# Patient Record
Sex: Female | Born: 1980 | Race: White | Hispanic: No | Marital: Married | Smoking: Never smoker
Health system: Southern US, Community
[De-identification: ages and names within clinical notes are randomized; demographics above are authoritative.]

## PROBLEM LIST (undated history)

## (undated) ENCOUNTER — Ambulatory Visit

## (undated) ENCOUNTER — Encounter

## (undated) HISTORY — PX: AUGMENTATION MAMMAPLASTY: SUR837

## (undated) HISTORY — PX: BREAST SURGERY: SHX581

---

## 1998-10-18 ENCOUNTER — Other Ambulatory Visit: Admission: RE | Admit: 1998-10-18 | Discharge: 1998-10-18 | Payer: Self-pay | Admitting: Obstetrics and Gynecology

## 1999-04-09 ENCOUNTER — Other Ambulatory Visit: Admission: RE | Admit: 1999-04-09 | Discharge: 1999-04-09 | Payer: Self-pay | Admitting: Obstetrics and Gynecology

## 1999-04-30 ENCOUNTER — Encounter (INDEPENDENT_AMBULATORY_CARE_PROVIDER_SITE_OTHER): Payer: Self-pay | Admitting: Specialist

## 1999-04-30 ENCOUNTER — Other Ambulatory Visit: Admission: RE | Admit: 1999-04-30 | Discharge: 1999-04-30 | Payer: Self-pay | Admitting: Obstetrics and Gynecology

## 1999-10-02 ENCOUNTER — Other Ambulatory Visit: Admission: RE | Admit: 1999-10-02 | Discharge: 1999-10-02 | Payer: Self-pay | Admitting: Obstetrics and Gynecology

## 2000-03-24 ENCOUNTER — Other Ambulatory Visit: Admission: RE | Admit: 2000-03-24 | Discharge: 2000-03-24 | Payer: Self-pay | Admitting: Obstetrics and Gynecology

## 2001-02-04 ENCOUNTER — Other Ambulatory Visit: Admission: RE | Admit: 2001-02-04 | Discharge: 2001-02-04 | Payer: Self-pay | Admitting: Obstetrics and Gynecology

## 2001-09-22 ENCOUNTER — Other Ambulatory Visit: Admission: RE | Admit: 2001-09-22 | Discharge: 2001-09-22 | Payer: Self-pay | Admitting: Obstetrics and Gynecology

## 2002-04-27 ENCOUNTER — Other Ambulatory Visit: Admission: RE | Admit: 2002-04-27 | Discharge: 2002-04-27 | Payer: Self-pay | Admitting: Obstetrics and Gynecology

## 2003-05-12 ENCOUNTER — Other Ambulatory Visit: Admission: RE | Admit: 2003-05-12 | Discharge: 2003-05-12 | Payer: Self-pay | Admitting: Obstetrics and Gynecology

## 2004-11-01 ENCOUNTER — Other Ambulatory Visit: Admission: RE | Admit: 2004-11-01 | Discharge: 2004-11-01 | Payer: Self-pay | Admitting: Obstetrics and Gynecology

## 2007-04-28 ENCOUNTER — Inpatient Hospital Stay (HOSPITAL_COMMUNITY): Admission: AD | Admit: 2007-04-28 | Discharge: 2007-05-01 | Payer: Self-pay | Admitting: Obstetrics and Gynecology

## 2007-06-29 ENCOUNTER — Emergency Department (HOSPITAL_BASED_OUTPATIENT_CLINIC_OR_DEPARTMENT_OTHER): Admission: EM | Admit: 2007-06-29 | Discharge: 2007-06-29 | Payer: Self-pay | Admitting: Emergency Medicine

## 2010-10-22 LAB — CBC
HCT: 29 — ABNORMAL LOW
Hemoglobin: 10.4 — ABNORMAL LOW
MCHC: 35.7
MCV: 94.3
RBC: 3.08 — ABNORMAL LOW
RDW: 12.9

## 2010-10-23 LAB — CBC
HCT: 24.9 — ABNORMAL LOW
MCHC: 34.8
MCV: 95.1
Platelets: 221
RDW: 13.6

## 2010-10-25 LAB — DIFFERENTIAL
Basophils Relative: 2 — ABNORMAL HIGH
Eosinophils Absolute: 0.2
Lymphs Abs: 1.5
Monocytes Relative: 6
Neutro Abs: 5.1
Neutrophils Relative %: 69

## 2010-10-25 LAB — CBC
MCHC: 35.2
MCV: 91.6
Platelets: 337
RBC: 3.83 — ABNORMAL LOW
WBC: 7.4

## 2010-10-25 LAB — URINALYSIS, ROUTINE W REFLEX MICROSCOPIC
Hgb urine dipstick: NEGATIVE
Protein, ur: NEGATIVE
Specific Gravity, Urine: 1.017
Urobilinogen, UA: 0.2

## 2010-10-25 LAB — BASIC METABOLIC PANEL
BUN: 24 — ABNORMAL HIGH
Calcium: 9.3
Creatinine, Ser: 1.1
GFR calc Af Amer: >60

## 2010-10-25 LAB — PREGNANCY, URINE: Preg Test, Ur: NEGATIVE

## 2013-09-21 ENCOUNTER — Other Ambulatory Visit: Payer: Self-pay | Admitting: Obstetrics and Gynecology

## 2013-09-21 DIAGNOSIS — R928 Other abnormal and inconclusive findings on diagnostic imaging of breast: Secondary | ICD-10-CM

## 2013-09-22 ENCOUNTER — Ambulatory Visit
Admission: RE | Admit: 2013-09-22 | Discharge: 2013-09-22 | Disposition: A | Discharge status: Home / Self Care | Payer: BC Managed Care – PPO | Source: Ambulatory Visit | Attending: Obstetrics and Gynecology | Admitting: Obstetrics and Gynecology

## 2013-09-22 ENCOUNTER — Encounter (INDEPENDENT_AMBULATORY_CARE_PROVIDER_SITE_OTHER): Payer: Self-pay

## 2013-09-22 DIAGNOSIS — R928 Other abnormal and inconclusive findings on diagnostic imaging of breast: Secondary | ICD-10-CM

## 2013-09-27 ENCOUNTER — Other Ambulatory Visit: Payer: Self-pay

## 2016-01-19 ENCOUNTER — Ambulatory Visit (HOSPITAL_COMMUNITY)
Admission: EM | Admit: 2016-01-19 | Discharge: 2016-01-19 | Disposition: A | Discharge status: Home / Self Care | Payer: BLUE CROSS/BLUE SHIELD | Attending: Family Medicine | Admitting: Family Medicine

## 2016-01-19 ENCOUNTER — Encounter (HOSPITAL_COMMUNITY): Payer: Self-pay | Admitting: Family Medicine

## 2016-01-19 DIAGNOSIS — F411 Generalized anxiety disorder: Secondary | ICD-10-CM | POA: Diagnosis not present

## 2016-01-19 MED ORDER — ALPRAZOLAM 0.25 MG PO TABS: 0.2500 mg | ORAL_TABLET | Freq: Every day | ORAL | 3 refills | Status: AC | PRN

## 2016-01-19 MED ORDER — FLUOXETINE HCL 10 MG PO TABS: 10.0000 mg | ORAL_TABLET | Freq: Every day | ORAL | 3 refills | Status: AC

## 2016-01-19 NOTE — ED Triage Notes (Signed)
Pt here for anxiety for months since she suffered a loss.

## 2016-01-19 NOTE — ED Provider Notes (Signed)
[facility]    CSN: [id number] Arrival date & time: 01/19/16  1437     History   Chief Complaint Chief Complaint  Patient presents with  . Anxiety    HPI Connie Espinoza is a 35 y.o. female.   This is a married 35 year old woman with an 37-year-old daughter. She been in her usual state of health until 2 months ago when her dog died and her father-in-law passed away. Since that time she's had overwhelming anxiety. She doesn't understand why this illness. She's been to see 3 doctors this week and a Veterinary surgeoncounselor. She plans on continuing with a counselor.  She's had workup including blood work and CAT scan of her brain and all this is been normal. She realizes that she has an anxiety problem but doesn't understand why.      History reviewed. No pertinent past medical history.  There are no active problems to display for this patient.   Past Surgical History:  Procedure Laterality Date  . BREAST SURGERY      OB History    No data available       Home Medications    Prior to Admission medications   Not on File    Family History History reviewed. No pertinent family history.  Social History Social History  Substance Use Topics  . Smoking status: Never Smoker  . Smokeless tobacco: Never Used  . Alcohol use Not on file     Allergies   Ciprofloxacin and Doxycycline   Review of Systems Review of Systems  Constitutional: Negative.   Psychiatric/Behavioral: The patient is nervous/anxious.      Physical Exam Triage Vital Signs ED Triage Vitals [01/19/16 1502]  Enc Vitals Group     BP 151/89     Pulse Rate 72     Resp 18     Temp 98.4 F (36.9 C)     Temp src      SpO2 100 %     Weight      Height      Head Circumference      Peak Flow      Pain Score      Pain Loc      Pain Edu?      Excl. in [place]?    No data found.   Updated Vital Signs BP 151/89   Pulse 72   Temp 98.4 F (36.9 C)   Resp 18   SpO2 100%    Physical  Exam  Constitutional: She is oriented to person, place, and time. She appears well-developed and well-nourished.  HENT:  Right Ear: External ear normal.  Mouth/Throat: Oropharynx is clear and moist.  Eyes: Conjunctivae are normal.  Neck: Normal range of motion. Neck supple.  Pulmonary/Chest: Effort normal.  Musculoskeletal: Normal range of motion.  Neurological: She is alert and oriented to person, place, and time.  Skin: Skin is warm and dry.  Psychiatric: Her behavior is normal. Judgment and thought content normal.  Nursing note and vitals reviewed.    UC Treatments / Results  Labs (all labs ordered are listed, but only abnormal results are displayed) Labs Reviewed - No data to display  EKG  EKG Interpretation None       Radiology No results found.  Procedures Procedures (including critical care time)  Medications Ordered in UC Medications - No data to display   Initial Impression / Assessment and Plan / UC Course  I have reviewed the triage vital signs and the  nursing notes.  Pertinent labs & imaging results that were available during my care of the patient were reviewed by me and considered in my medical decision making (see chart for details).  Clinical Course     Final Clinical Impressions(s) / UC Diagnoses   Final diagnoses:  None    New Prescriptions New Prescriptions   No medications on file     Elvina SidleKurt Takara Sermons, MD 01/19/16 1551

## 2016-01-19 NOTE — Discharge Instructions (Signed)
Let me know if this is not working

## 2016-04-30 ENCOUNTER — Other Ambulatory Visit: Payer: Self-pay | Admitting: Family Medicine

## 2018-02-25 ENCOUNTER — Other Ambulatory Visit: Payer: Self-pay | Admitting: Obstetrics and Gynecology

## 2018-02-25 DIAGNOSIS — Z1231 Encounter for screening mammogram for malignant neoplasm of breast: Secondary | ICD-10-CM

## 2018-04-06 ENCOUNTER — Ambulatory Visit
Admission: RE | Admit: 2018-04-06 | Discharge: 2018-04-06 | Disposition: A | Discharge status: Home / Self Care | Payer: BLUE CROSS/BLUE SHIELD | Source: Ambulatory Visit | Attending: Obstetrics and Gynecology | Admitting: Obstetrics and Gynecology

## 2018-04-06 DIAGNOSIS — Z1231 Encounter for screening mammogram for malignant neoplasm of breast: Secondary | ICD-10-CM

## 2018-04-08 ENCOUNTER — Other Ambulatory Visit: Payer: Self-pay

## 2018-04-08 ENCOUNTER — Ambulatory Visit
Admission: RE | Admit: 2018-04-08 | Discharge: 2018-04-08 | Disposition: A | Discharge status: Home / Self Care | Payer: BLUE CROSS/BLUE SHIELD | Source: Ambulatory Visit | Attending: Obstetrics and Gynecology | Admitting: Obstetrics and Gynecology

## 2018-04-08 ENCOUNTER — Other Ambulatory Visit: Payer: Self-pay | Admitting: Obstetrics and Gynecology

## 2018-04-08 ENCOUNTER — Ambulatory Visit: Payer: BLUE CROSS/BLUE SHIELD

## 2018-04-08 DIAGNOSIS — R928 Other abnormal and inconclusive findings on diagnostic imaging of breast: Secondary | ICD-10-CM

## 2019-02-11 ENCOUNTER — Emergency Department (INDEPENDENT_AMBULATORY_CARE_PROVIDER_SITE_OTHER)
Admission: EM | Admit: 2019-02-11 | Discharge: 2019-02-11 | Disposition: A | Discharge status: Home / Self Care | Payer: BC Managed Care – PPO | Source: Home / Self Care

## 2019-02-11 ENCOUNTER — Encounter: Payer: Self-pay | Admitting: Family Medicine

## 2019-02-11 ENCOUNTER — Other Ambulatory Visit: Payer: Self-pay

## 2019-02-11 DIAGNOSIS — R7989 Other specified abnormal findings of blood chemistry: Secondary | ICD-10-CM | POA: Diagnosis not present

## 2019-02-11 LAB — POCT CBC W AUTO DIFF (K'VILLE URGENT CARE)

## 2019-02-11 NOTE — ED Provider Notes (Addendum)
[facility]    CSN: [id number] Arrival date & time: 02/11/19  1841      History   Chief Complaint Chief Complaint  Patient presents with  . Labs Only    HPI Connie Espinoza is a 39 y.o. female.   This is a 39 year old woman making her initial visit to [facility] urgent care. She is asking for a platelet count.  Patient had her physical today and when she went online she noticed that her platelet count was critically low at 28.  She tried to call her PCP but the phone call would not go through after repeated attempts.  Patient's had no significant prescription medications that would cause this.  She has had no bruising.     History reviewed. No pertinent past medical history.  There are no problems to display for this patient.   Past Surgical History:  Procedure Laterality Date  . AUGMENTATION MAMMAPLASTY Bilateral    gel implants retro pectoral  . BREAST SURGERY      OB History   No obstetric history on file.      Home Medications    Prior to Admission medications   Medication Sig Start Date End Date Taking? Authorizing Provider  Dupilumab (DUPIXENT Owen) Inject into the skin. monthly   Yes [provider]  escitalopram (LEXAPRO) 10 MG tablet Take 10 mg by mouth daily.   Yes [provider]  ALPRAZolam (XANAX) 0.25 MG tablet Take 1 tablet (0.25 mg total) by mouth daily as needed for anxiety. 01/19/16   Robyn Haber, MD  FLUoxetine (PROZAC) 10 MG tablet Take 1 tablet (10 mg total) by mouth daily. 01/19/16   Robyn Haber, MD    Family History History reviewed. No pertinent family history.  Social History Social History   Tobacco Use  . Smoking status: Never Smoker  . Smokeless tobacco: Never Used  Substance Use Topics  . Alcohol use: Not on file  . Drug use: Not on file     Allergies   Ciprofloxacin and Doxycycline   Review of Systems Review of Systems  All other systems reviewed and are  negative.    Physical Exam Triage Vital Signs ED Triage Vitals  Enc Vitals Group     BP      Pulse      Resp      Temp      Temp src      SpO2      Weight      Height      Head Circumference      Peak Flow      Pain Score      Pain Loc      Pain Edu?      Excl. in [place]?    No data found.  Updated Vital Signs BP (!) 148/84   Pulse 94   Temp 97.9 F (36.6 C) (Oral)   Resp 16   SpO2 99%   Physical Exam Vitals and nursing note reviewed.  Constitutional:      General: She is not in acute distress.    Appearance: Normal appearance. She is normal weight.  HENT:     Head: Normocephalic.  Eyes:     Conjunctiva/sclera: Conjunctivae normal.  Pulmonary:     Effort: Pulmonary effort is normal.  Musculoskeletal:        General: Normal range of motion.     Cervical back: Normal range of motion and neck supple.  Skin:    General:  Skin is warm and dry.     Coloration: Skin is not pale.     Findings: No bruising or erythema.  Neurological:     General: No focal deficit present.     Mental Status: She is alert.  Psychiatric:        Mood and Affect: Mood normal.        Behavior: Behavior normal.        Thought Content: Thought content normal.        Judgment: Judgment normal.      UC Treatments / Results  Labs (all labs ordered are listed, but only abnormal results are displayed) Labs Reviewed  POCT CBC W AUTO DIFF ([facility])  plts:  334  EKG   Radiology No results found.  Procedures Procedures (including critical care time)  Medications Ordered in UC Medications - No data to display  Initial Impression / Assessment and Plan / UC Course  I have reviewed the triage vital signs and the nursing notes.  Pertinent labs & imaging results that were available during my care of the patient were reviewed by me and considered in my medical decision making (see chart for details).    Final Clinical Impressions(s) / UC Diagnoses   Final diagnoses:   None   Discharge Instructions   None    ED Prescriptions    None     I have reviewed the PDMP during this encounter.   Elvina Sidle, MD 02/11/19 Audelia Acton    Elvina Sidle, MD 02/11/19 [phone]

## 2019-02-11 NOTE — ED Triage Notes (Signed)
PT got annual physical today and PLT count was 28. PT here for redraw.

## 2019-03-05 ENCOUNTER — Other Ambulatory Visit: Payer: Self-pay | Admitting: Obstetrics and Gynecology

## 2019-03-05 DIAGNOSIS — Z1231 Encounter for screening mammogram for malignant neoplasm of breast: Secondary | ICD-10-CM

## 2019-04-12 ENCOUNTER — Ambulatory Visit: Payer: BC Managed Care – PPO

## 2019-05-31 ENCOUNTER — Ambulatory Visit
Admission: RE | Admit: 2019-05-31 | Discharge: 2019-05-31 | Disposition: A | Discharge status: Home / Self Care | Payer: BC Managed Care – PPO | Source: Ambulatory Visit | Attending: Obstetrics and Gynecology | Admitting: Obstetrics and Gynecology

## 2019-05-31 ENCOUNTER — Other Ambulatory Visit: Payer: Self-pay

## 2019-05-31 DIAGNOSIS — Z1231 Encounter for screening mammogram for malignant neoplasm of breast: Secondary | ICD-10-CM

## 2020-04-27 ENCOUNTER — Other Ambulatory Visit: Payer: Self-pay | Admitting: Obstetrics and Gynecology

## 2020-04-27 DIAGNOSIS — Z1231 Encounter for screening mammogram for malignant neoplasm of breast: Secondary | ICD-10-CM

## 2020-06-19 ENCOUNTER — Ambulatory Visit
Admission: RE | Admit: 2020-06-19 | Discharge: 2020-06-19 | Disposition: A | Discharge status: Home / Self Care | Payer: BC Managed Care – PPO | Source: Ambulatory Visit | Attending: Obstetrics and Gynecology | Admitting: Obstetrics and Gynecology

## 2020-06-19 ENCOUNTER — Other Ambulatory Visit: Payer: Self-pay | Admitting: Obstetrics and Gynecology

## 2020-06-19 ENCOUNTER — Other Ambulatory Visit: Payer: Self-pay

## 2020-06-19 DIAGNOSIS — Z1231 Encounter for screening mammogram for malignant neoplasm of breast: Secondary | ICD-10-CM

## 2021-05-24 ENCOUNTER — Other Ambulatory Visit: Payer: Self-pay | Admitting: Obstetrics and Gynecology

## 2021-05-24 DIAGNOSIS — Z1231 Encounter for screening mammogram for malignant neoplasm of breast: Secondary | ICD-10-CM

## 2021-06-26 ENCOUNTER — Ambulatory Visit
Admission: RE | Admit: 2021-06-26 | Discharge: 2021-06-26 | Disposition: A | Discharge status: Home / Self Care | Payer: BC Managed Care – PPO | Source: Ambulatory Visit | Attending: Obstetrics and Gynecology | Admitting: Obstetrics and Gynecology

## 2021-06-26 DIAGNOSIS — Z1231 Encounter for screening mammogram for malignant neoplasm of breast: Secondary | ICD-10-CM

## 2021-08-15 IMAGING — MG DIGITAL SCREENING BREAST BILAT IMPLANT W/ TOMO W/ CAD
9 of 16 series · 9 of 40 positions shown · non-contrast
Comparison: Previous exam(s).

CLINICAL DATA: Screening.

EXAM:
DIGITAL SCREENING BILATERAL MAMMOGRAM WITH IMPLANTS, CAD AND
TOMOSYNTHESIS
TECHNIQUE: Bilateral screening digital craniocaudal and mediolateral oblique
mammograms were obtained. Bilateral screening digital breast
tomosynthesis was performed. The images were evaluated with
computer-aided detection. Standard and/or implant displaced views
were performed.

[R CC]
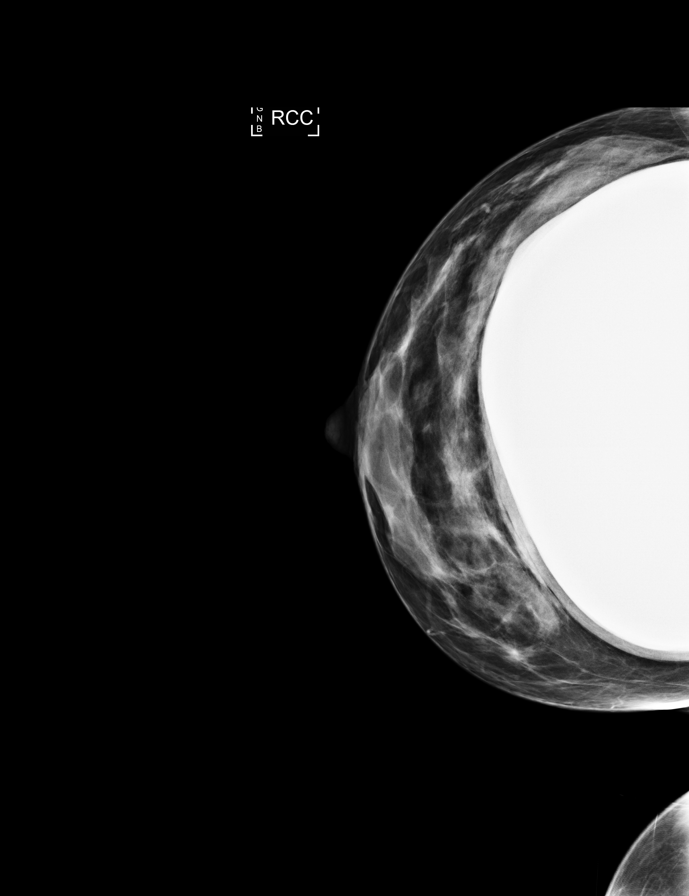

[L CC]
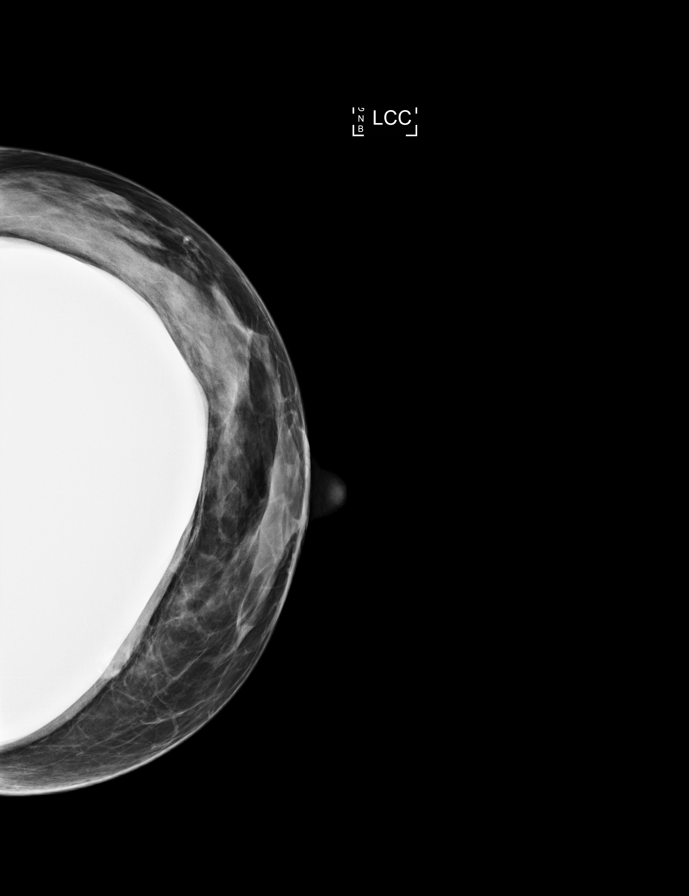

[L MLO]
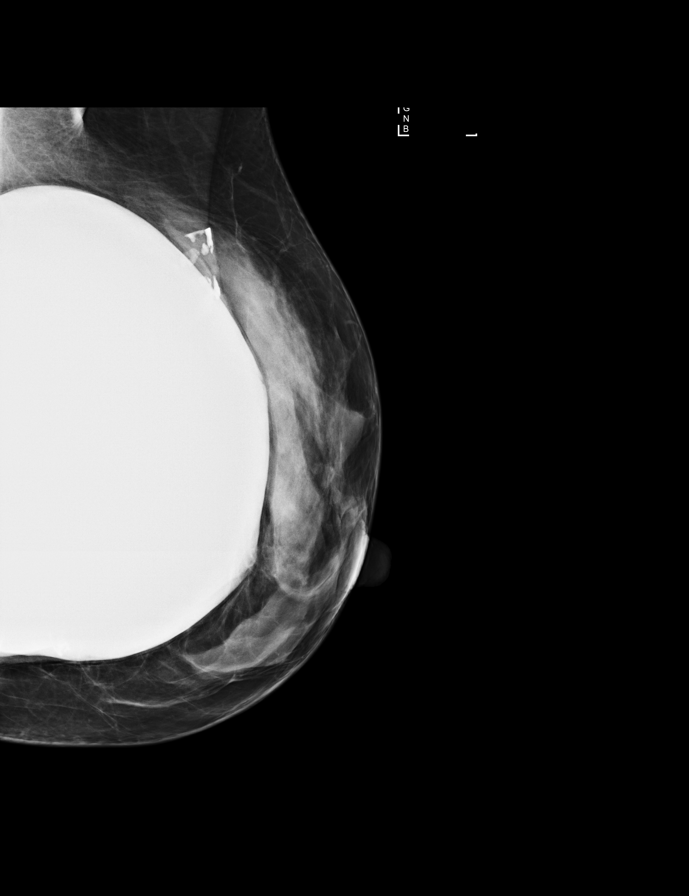

[R MLO]
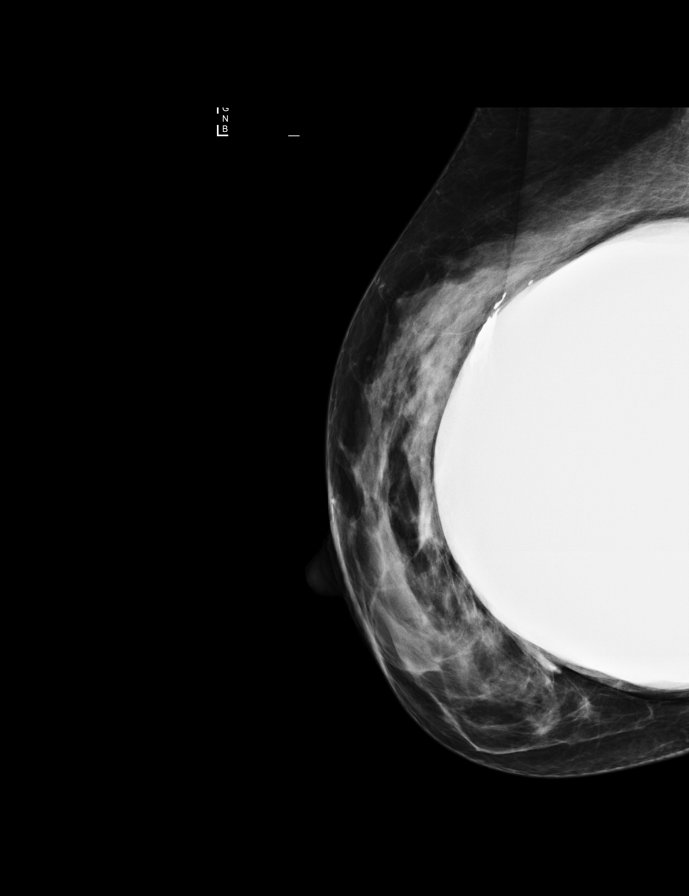

[R CC synth-2D]
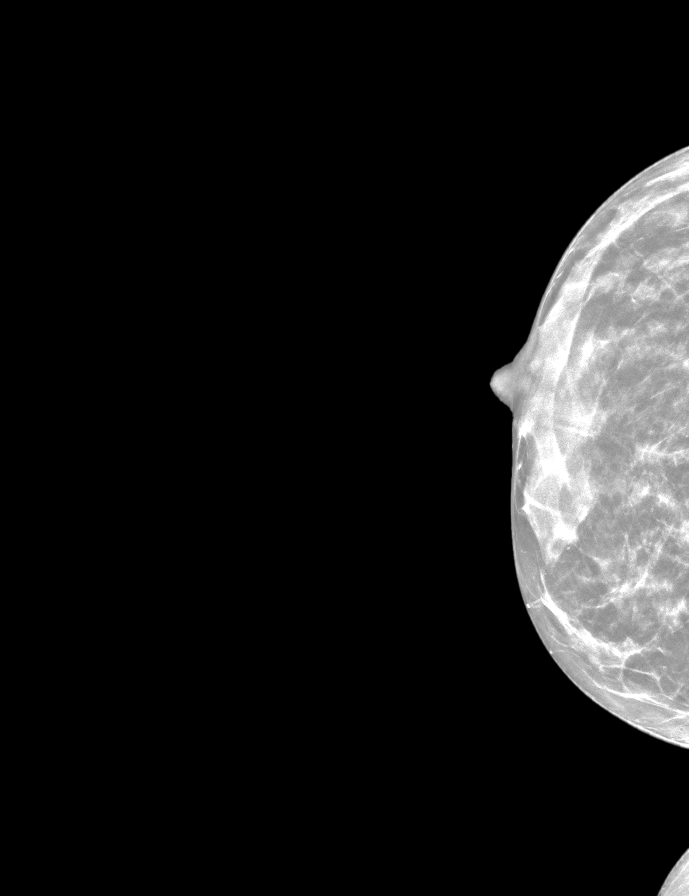

[L MLO synth-2D (1 of 2)]
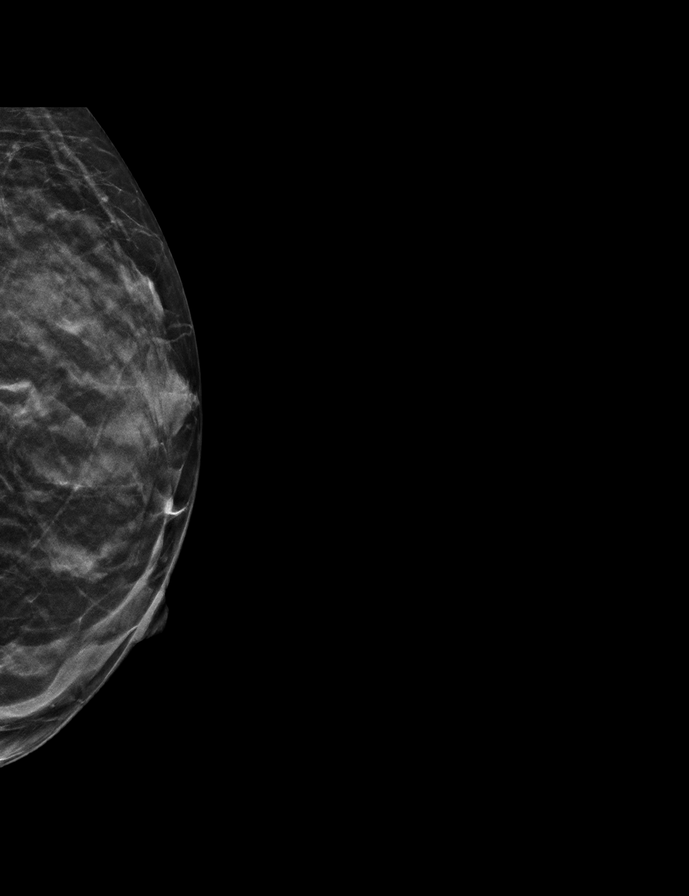

[L CC synth-2D]
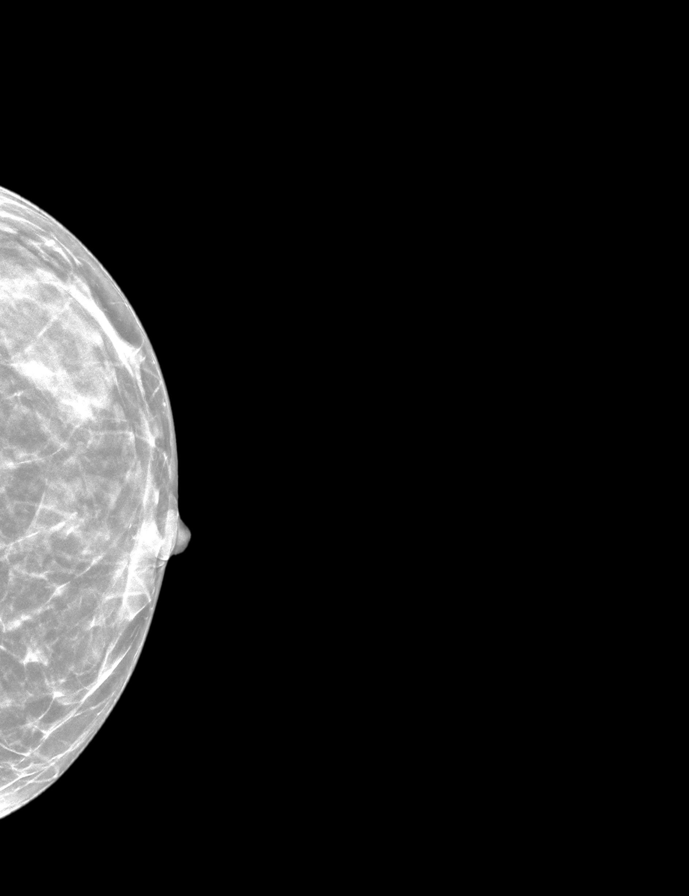

[L MLO synth-2D (2 of 2)]
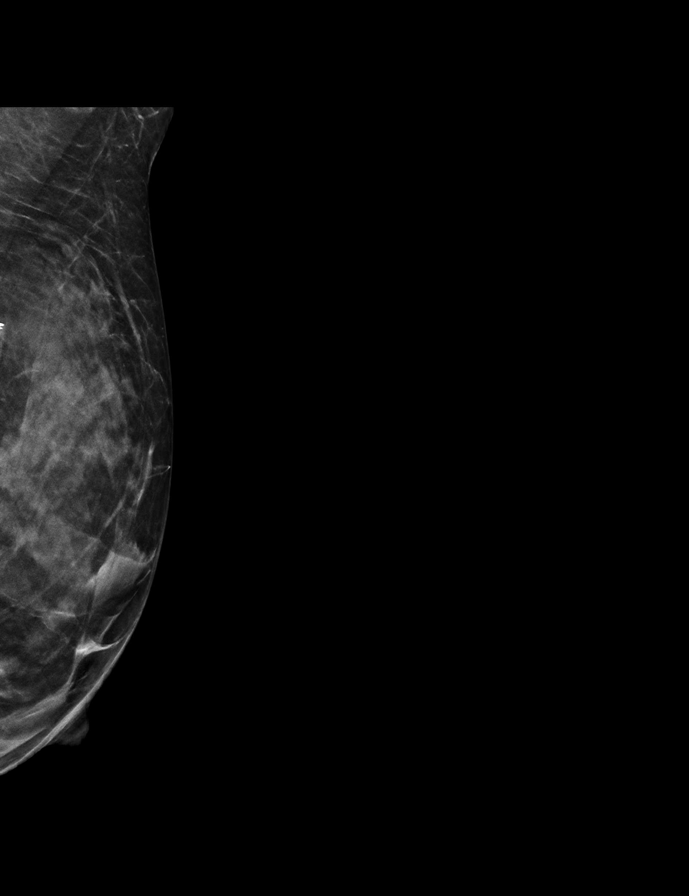

[R MLO synth-2D]
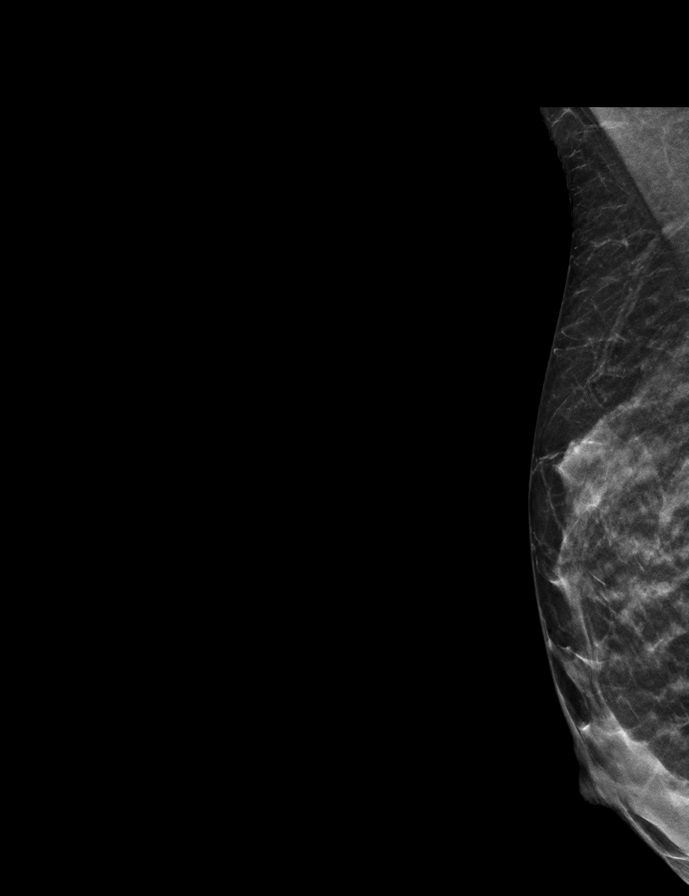

[9 of 40 positions shown; findings below may reference images not displayed]

ACR Breast Density Category c: The breast tissue is heterogeneously
dense, which may obscure small masses.
FINDINGS: The patient has retropectoral implants. There are no findings
suspicious for malignancy.
IMPRESSION: No mammographic evidence of malignancy. A result letter of this
screening mammogram will be mailed directly to the patient.

RECOMMENDATION:
Screening mammogram in one year. (Code:LT-E-7TH)

BI-RADS CATEGORY  1:  Negative.

## 2022-01-02 MED ORDER — DUPIXENT 300 MG/2 ML SUBCUTANEOUS PEN INJECTOR
SUBCUTANEOUS | 11 refills | 0 days
Start: 2022-01-02 — End: ?

## 2022-01-04 DIAGNOSIS — L209 Atopic dermatitis, unspecified: Principal | ICD-10-CM

## 2022-01-04 NOTE — Unmapped (Signed)
[facility] Specialty Medication Onboarding    Specialty Medication: DUPIXENT PEN 300 mg/2 mL Pnij (dupilumab)  Prior Authorization: Approved   Financial Assistance: No - patient must call for financial assistance per MAPs referral  Final Copay/Day Supply: $1,610.96 / 28    Insurance Restrictions: Yes - max 1 month supply     Notes to Pharmacist: Patient needs to apply for copay assistance, MAPs unable to apply on behalf of patient (unable to send text).    The triage team has completed the benefits investigation and has determined that the patient is able to fill this medication at [facility]. Please contact the patient to complete the onboarding or follow up with the prescribing physician as needed.

## 2022-01-08 NOTE — Unmapped (Signed)
[facility]   Patient Onboarding/Medication Counseling    Rhonda Forbes is a 41 y.o. female with Atopic Dermatitis who I am counseling today on continuation of therapy.  I am speaking to the patient.    Was a Nurse, learning disability used for this call? No    Verified patient's date of birth / HIPAA.    Specialty medication(s) to be sent: Inflammatory Disorders: Dupixent      Non-specialty medications/supplies to be sent: NA      Medications not needed at this time: NA       The patient declined counseling on medication administration, missed dose instructions, goals of therapy, side effects and monitoring parameters, warnings and precautions, drug/food interactions, and storage, handling precautions, and disposal because they have taken the medication previously. The information in the declined sections below are for informational purposes only and was not discussed with patient.       Dupixent (dupilumab)    Medication & Administration     Dosage: Atopic dermatitis: Inject 600mg  under the skin as a loading dose followed by 300mg  every 14 days thereafter (maintenance only)    Administration:     Dupixent Pen  1. Gather all supplies needed for injection on a clean, flat working surface: medication syringe removed from packaging, alcohol swab, sharps container, etc.  2. Look at the medication label - look for correct medication, correct dose, and check the expiration date  3. Look at the medication - the liquid in the pen should appear clear and colorless to pale yellow  4. Lay the pen on a flat surface and allow it to warm up to room temperature for at least 45 minutes  5. Select injection site - you can use the front of your thigh or your belly (but not the area 2 inches around your belly button); if someone else is giving you the injection you can also use your upper arm in the skin covering your triceps muscle  6. Prepare injection site - wash your hands and clean the skin at the injection site with an alcohol swab and let it air dry, do not touch the injection site again before the injection  7. Hold the middle of the body of the pen and gently pull the needle safety cap straight out. Be careful not to bend the needle. Do not remove until immediately prior to injection  8. Press the pen down onto the injection site at a 90 degree angle.   9. You will hear a click as the injection starts, and then a second click when the injection is ALMOST done. Keep holding the pen against the skin for 5 more seconds after the second click.   10. Check that the pen is empty by looking in the viewing window - the yellow indicator bar should be stopped, and should fill the window.   11. Remove the pen from the skin by lifting straight up.   12. Dispose of the used pen immediately in your sharps disposal container  13. If you see any blood at the injection site, press a cotton ball or gauze on the site and maintain pressure until the bleeding stops, do not rub the injection site    Adherence/Missed dose instructions:  If a dose is missed, administer within 7 days from the missed dose and then resume the original schedule. If the missed dose is not administered within 7 days, you can either wait until the next dose on the original schedule or take your  dose now and resume every 14 days from the new injection date. Do not use 2 doses at the same time or extra doses.      Goals of Therapy     -Reduce symptoms of pruritus and dermatitis  -Prevent exacerbations  -Minimize therapeutic risks  -Avoidance of long-term systemic and topical glucocorticoid use  -Maintenance of effective psychosocial functioning    Side Effects & Monitoring Parameters     Injection site reaction (redness, irritation, inflammation localized to the site of administration)  Signs of a common cold - minor sore throat, runny or stuffy nose, etc.  Recurrence of cold sores (herpes simplex)      The following side effects should be reported to the provider:  Signs of a hypersensitivity reaction - rash; hives; itching; red, swollen, blistered, or peeling skin; wheezing; tightness in the chest or throat; difficulty breathing, swallowing, or talking; swelling of the mouth, face, lips, tongue, or throat; etc.  Eye pain or irritation or any visual disturbances  Shortness of breath or worsening of breathing      Contraindications, Warnings, & Precautions     Have your bloodwork checked as you have been told by your prescriber   Birth control pills and other hormone-based birth control may not work as well to prevent pregnancy  Talk with your doctor if you are pregnant, planning to become pregnant, or breastfeeding  Discuss the possible need for holding your dose(s) of Dupixent?? when a planned procedure is scheduled with the prescriber as it may delay healing/recovery timeline       Drug/Food Interactions     Medication list reviewed in Epic. The patient was instructed to inform the care team before taking any new medications or supplements. No drug interactions identified.   Talk with you prescriber or pharmacist before receiving any live vaccinations while taking this medication and after you stop taking it    Storage, Handling Precautions, & Disposal     Store this medication in the refrigerator.  Do not freeze  If needed, you may store at room temperature for up to 14 days  Store in original packaging, protected from light  Do not shake  Dispose of used syringes in a sharps disposal container            Current Medications (including OTC/herbals), Comorbidities and Allergies     Current Outpatient Medications   Medication Sig Dispense Refill    dupilumab (DUPIXENT PEN) 300 mg/2 mL PnIj Inject the contents of 1 pen (300 mg total) under the skin every fourteen (14) days. 4 mL 11     No current facility-administered medications for this visit.       Not on File    There is no problem list on file for this patient.      Reviewed and up to date in Epic.    Appropriateness of Therapy Acute infections noted within Epic:  No active infections  Patient reported infection: None    Is medication and dose appropriate based on diagnosis and infection status? Yes    Prescription has been clinically reviewed: Yes      Baseline Quality of Life Assessment      How many days over the past month did your atopic dermatitis  keep you from your normal activities? For example, brushing your teeth or getting up in the morning. 0    Financial Information     Medication Assistance provided: Prior Authorization     Anticipated copay of $0 with patient  obtained copay card reviewed with patient. Verified delivery address.    Delivery Information     Scheduled delivery date: 1/9    Expected start date: 1/11    Medication will be delivered via UPS to the prescription address in [facility].  This shipment will not require a signature.      Explained the services we provide at [facility] and that each month we would call to set up refills.  Stressed importance of returning phone calls so that we could ensure they receive their medications in time each month.  Informed patient that we should be setting up refills 7-10 days prior to when they will run out of medication.  A pharmacist will reach out to perform a clinical assessment periodically.  Informed patient that a welcome packet, containing information about our pharmacy and other support services, a Notice of Privacy Practices, and a drug information handout will be sent.      The patient or caregiver noted above participated in the development of this care plan and knows that they can request review of or adjustments to the care plan at any time.      Patient or caregiver verbalized understanding of the above information as well as how to contact the pharmacy at [phone] option 4 with any questions/concerns.  The pharmacy is open Monday through Friday 8:30am-4:30pm.  A pharmacist is available 24/7 via pager to answer any clinical questions they may have.    Patient Specific Needs     Does the patient have any physical, cognitive, or cultural barriers? No    Does the patient have adequate living arrangements? (i.e. the ability to store and take their medication appropriately) Yes    Did you identify any home environmental safety or security hazards? No    Patient prefers to have medications discussed with  Patient     Is the patient or caregiver able to read and understand education materials at a high school level or above? Yes    Patient's primary language is  English     Is the patient high risk? No    SOCIAL DETERMINANTS OF HEALTH     At the [facility], we have learned that life circumstances - like trouble affording food, housing, utilities, or transportation can affect the health of many of our patients.   That is why we wanted to ask: are you currently experiencing any life circumstances that are negatively impacting your health and/or quality of life? Patient declined to answer    Social Determinants of Health     Financial Resource Strain: Not on file   Internet Connectivity: Not on file   Food Insecurity: Not on file   Tobacco Use: Not on file   Housing/Utilities: Not on file   Alcohol Use: Not on file   Transportation Needs: Not on file   Substance Use: Not on file   Health Literacy: Not on file   Physical Activity: Not on file   Interpersonal Safety: Not on file   Stress: Not on file   Intimate Partner Violence: Not on file   Depression: Not on file   Social Connections: Not on file       Would you be willing to receive help with any of the needs that you have identified today? Not applicable       Meghan A Desiree Lucy Shared [facility] Specialty Pharmacist

## 2022-02-04 MED FILL — DUPIXENT 300 MG/2 ML SUBCUTANEOUS PEN INJECTOR: SUBCUTANEOUS | 28 days supply | Qty: 4 | Fill #0

## 2022-03-04 NOTE — Unmapped (Signed)
[facility] Refill Coordination Note    Specialty Medication(s) to be Shipped:   Inflammatory Disorders: Dupixent    Other medication(s) to be shipped: No additional medications requested for fill at this time     Sicily Maund, DOB: 1980/04/09  Phone: There are no phone numbers on file.      All above HIPAA information was verified with patient.     Was a Nurse, learning disability used for this call? No    Completed refill call assessment today to schedule patient's medication shipment from the [facility] [phone]).  All relevant notes have been reviewed.     Specialty medication(s) and dose(s) confirmed: Regimen is correct and unchanged.   Changes to medications: Teela reports no changes at this time.  Changes to insurance: No  New side effects reported not previously addressed with a pharmacist or physician: None reported  Questions for the pharmacist: No    Confirmed patient received a Conservation officer, historic buildings and a Surveyor, mining with first shipment. The patient will receive a drug information handout for each medication shipped and additional FDA Medication Guides as required.       DISEASE/MEDICATION-SPECIFIC INFORMATION        For patients on injectable medications: Patient currently has 0 doses left.  Next injection is scheduled for 2/8.    SPECIALTY MEDICATION ADHERENCE     Medication Adherence    Patient reported X missed doses in the last month: 0  Specialty Medication: DUPIXENT PEN 300 mg/2 mL  Patient is on additional specialty medications: No                                Were doses missed due to medication being on hold? No    Dupixent 300/2 mg/ml: 0 days of medicine on hand         REFERRAL TO PHARMACIST     Referral to the pharmacist: Not needed      [facility]     Shipping address confirmed in Epic.     Delivery Scheduled: Yes, Expected medication delivery date: 03/06/22.     Medication will be delivered via UPS to the prescription address in Epic [facility].    Willette Pa   [facility] Specialty Technician

## 2022-03-05 NOTE — Unmapped (Signed)
Rhonda Forbes 's DUPIXENT PEN 300 mg/2 mL Pnij (dupilumab) shipment will be delayed as a result of a high copay.     I have reached out to the patient  at [phone] and left a voicemail message.  We will wait for a call back from the patient to reschedule the delivery.  We have not confirmed the new delivery date.

## 2022-03-07 MED FILL — DUPIXENT 300 MG/2 ML SUBCUTANEOUS PEN INJECTOR: SUBCUTANEOUS | 28 days supply | Qty: 4 | Fill #1

## 2022-03-07 NOTE — Unmapped (Signed)
Rhonda Forbes 's DUPIXENT PEN 300 mg/2 mL Pnij (dupilumab) shipment will be sent out  as a result of copay is now approved by patient/caregiver.      I have reached out to the patient  at [phone] and communicated the delivery change. We will reschedule the medication for the delivery date that the patient agreed upon.  We have confirmed the delivery date as 03/08/22

## 2022-04-02 NOTE — Unmapped (Signed)
The [facility] has made a second and final attempt to reach this patient to refill the following medication: Dupixent.      We have left voicemails on the following phone numbers: [phone] .    Dates contacted: 03/27/22 & 04/02/22  Last scheduled delivery: 03/04/22 (28 day supply)    The patient may be at risk of non-compliance with this medication. The patient should call the [facility] at [phone]  Option 4, then Option 2 (all other specialty patients) to refill medication.    Willette Pa   [facility] Specialty Technician

## 2022-04-10 NOTE — Unmapped (Signed)
[facility] Refill Coordination Note    Specialty Medication(s) to be Shipped:   Inflammatory Disorders: Dupixent    Other medication(s) to be shipped: No additional medications requested for fill at this time     Rhonda Forbes, DOB: September 20, 1980  Phone: There are no phone numbers on file.      All above HIPAA information was verified with patient.     Was a Nurse, learning disability used for this call? No    Completed refill call assessment today to schedule patient's medication shipment from the [facility] [phone]).  All relevant notes have been reviewed.     Specialty medication(s) and dose(s) confirmed: Regimen is correct and unchanged.   Changes to medications: Rhonda Forbes reports no changes at this time.  Changes to insurance: No  New side effects reported not previously addressed with a pharmacist or physician: None reported  Questions for the pharmacist: No    Confirmed patient received a Conservation officer, historic buildings and a Surveyor, mining with first shipment. The patient will receive a drug information handout for each medication shipped and additional FDA Medication Guides as required.       DISEASE/MEDICATION-SPECIFIC INFORMATION        For patients on injectable medications: Patient currently has 0 doses left.  Next injection is scheduled for 04/13/22.    SPECIALTY MEDICATION ADHERENCE     Medication Adherence    Patient reported X missed doses in the last month: 0  Specialty Medication: DUPIXENT PEN 300 mg/2 mL Pnij (dupilumab)  Patient is on additional specialty medications: No  Patient is on more than two specialty medications: No              Were doses missed due to medication being on hold? No    Dupixent 300/2 mg/ml: 0 days of medicine on hand         REFERRAL TO PHARMACIST     Referral to the pharmacist: Not needed      [facility]     Shipping address confirmed in Epic.     Delivery Scheduled: Yes, Expected medication delivery date: 04/12/22.     Medication will be delivered via UPS to the prescription address in Epic WAM.    Rhonda Forbes   [facility] Specialty Technician

## 2022-04-11 MED FILL — DUPIXENT 300 MG/2 ML SUBCUTANEOUS PEN INJECTOR: SUBCUTANEOUS | 28 days supply | Qty: 4 | Fill #2

## 2022-05-14 NOTE — Unmapped (Signed)
The [facility] has made a third and final attempt to reach this patient to refill the following medication: Dupixent.      We have left voicemails on the following phone numbers: [phone], have sent a text message to the following phone numbers: [phone], and have sent a Mychart questionnaire..    Dates contacted: 05/03/22, 05/10/22, and 05/14/22  Last scheduled delivery: 04/10/22 (28 day supply)    The patient may be at risk of non-compliance with this medication. The patient should call the [facility] at [phone]  Option 4, then Option 2: Dermatology, Gastroenterology, Rheumatology to refill medication.    Willette Pa   [facility] Specialty Technician

## 2022-05-15 NOTE — Unmapped (Signed)
[facility] Refill Coordination Note    Rayni Hindsman, DOB: 08-28-1980  Phone: There are no phone numbers on file.      All above HIPAA information was verified with patient.         05/14/2022     4:40 PM   Specialty Rx Medication Refill Questionnaire   Which Medications would you like refilled and shipped? Dupixent   Please list all current allergies: Cipro   Have you missed any doses in the last 30 days? No   Have you had any changes to your medication(s) since your last refill? No   How many days remaining of each medication do you have at home? 7   If receiving an injectable medication, next injection date is 05/30/2022   Have you experienced any side effects in the last 30 days? No   Please enter the full address (street address, city, state, zip code) where you would like your medication(s) to be delivered to. [address], [place], [place] [address]   Please specify on which day you would like your medication(s) to arrive. Note: if you need your medication(s) within 3 days, please call the pharmacy to schedule your order at [phone]  05/27/2022   Has your insurance changed since your last refill? No   Would you like a pharmacist to call you to discuss your medication(s)? No   Do you require a signature for your package? (Note: if we are billing Medicare Part B or your order contains a controlled substance, we will require a signature) No         Completed refill call assessment today to schedule patient's medication shipment from the [facility] [phone]).  All relevant notes have been reviewed.       Confirmed patient received a Conservation officer, historic buildings and a Surveyor, mining with first shipment. The patient will receive a drug information handout for each medication shipped and additional FDA Medication Guides as required.         REFERRAL TO PHARMACIST     Referral to the pharmacist: Not needed      [facility]     Shipping address confirmed in Epic.     Delivery Scheduled: Yes, Expected medication delivery date: 05/28/22.     Medication will be delivered via UPS to the prescription address in Epic WAM.    Willette Pa   [facility] Specialty Technician

## 2022-05-23 ENCOUNTER — Other Ambulatory Visit: Payer: Self-pay | Admitting: Obstetrics and Gynecology

## 2022-05-23 DIAGNOSIS — Z0000 Encounter for general adult medical examination without abnormal findings: Secondary | ICD-10-CM

## 2022-05-27 NOTE — Unmapped (Signed)
Rhonda Forbes 's DUPIXENT PEN 300 mg/2 mL Pnij (dupilumab) shipment will be delayed as a result of a high copay.     I have reached out to the patient  at [phone] and left a voicemail message.  We will wait for a call back from the patient to reschedule the delivery.  We have not confirmed the new delivery date.

## 2022-05-29 NOTE — Unmapped (Signed)
Idamae Schuller 's DUPIXENT PEN 300 mg/2 mL Pnij (dupilumab) shipment will be canceled  as a result of a high copay.     I have reached out to the patient  at [phone] and left a voicemail message.  We will not reschedule the medication and have removed this/these medication(s) from the work request.  We have canceled this work request.

## 2022-06-25 NOTE — Unmapped (Signed)
The [facility] has made a second and final attempt to reach this patient to refill the following medication:DUPIXENT PEN 300 mg/2 mL Pnij (dupilumab).      We have left voicemails on the following phone numbers: [phone] and have sent a MyChart message.    Dates contacted: 5/21, 5/28  Last scheduled delivery: 04/11/22    The patient may be at risk of non-compliance with this medication. The patient should call the [facility] at [phone]  Option 4, then Option 2: Dermatology, Gastroenterology, Rheumatology to refill medication.    Gaspar Cola Shared [facility] Specialty Technician

## 2022-07-01 ENCOUNTER — Ambulatory Visit
Admission: RE | Admit: 2022-07-01 | Discharge: 2022-07-01 | Disposition: A | Discharge status: Home / Self Care | Payer: BC Managed Care – PPO | Source: Ambulatory Visit | Attending: Obstetrics and Gynecology | Admitting: Obstetrics and Gynecology

## 2022-07-01 DIAGNOSIS — Z0000 Encounter for general adult medical examination without abnormal findings: Secondary | ICD-10-CM

## 2022-07-29 ENCOUNTER — Ambulatory Visit: Payer: BC Managed Care – PPO | Attending: Internal Medicine | Admitting: Internal Medicine

## 2022-07-29 VITALS — BP 102/64 | HR 76 | Ht 63.0 in | Wt 118.8 lb

## 2022-07-29 DIAGNOSIS — Z8249 Family history of ischemic heart disease and other diseases of the circulatory system: Secondary | ICD-10-CM

## 2022-07-29 NOTE — Patient Instructions (Signed)
Medication Instructions:  Your physician recommends that you continue on your current medications as directed. Please refer to the Current Medication list given to you today.  *If you need a refill on your cardiac medications before your next appointment, please call your pharmacy*   Lab Work: Your physician recommends that you have labs drawn: Lp(a) If you have labs (blood work) drawn today and your tests are completely normal, you will receive your results only by: MyChart Message (if you have MyChart) OR A paper copy in the mail If you have any lab test that is abnormal or we need to change your treatment, we will call you to review the results.   Testing/Procedures: This is $99 out of pocket.   Coronary CalciumScan A coronary calcium scan is an imaging test used to look for deposits of calcium and other fatty materials (plaques) in the inner lining of the blood vessels of the heart (coronary arteries). These deposits of calcium and plaques can partly clog and narrow the coronary arteries without producing any symptoms or warning signs. This puts a person at risk for a heart attack. This test can detect these deposits before symptoms develop. Tell a health care provider about: Any allergies you have. All medicines you are taking, including vitamins, herbs, eye drops, creams, and over-the-counter medicines. Any problems you or family members have had with anesthetic medicines. Any blood disorders you have. Any surgeries you have had. Any medical conditions you have. Whether you are pregnant or may be pregnant. What are the risks? Generally, this is a safe procedure. However, problems may occur, including: Harm to a pregnant woman and her unborn baby. This test involves the use of radiation. Radiation exposure can be dangerous to a pregnant woman and her unborn baby. If you are pregnant, you generally should not have this procedure done. Slight increase in the risk of cancer. This is  because of the radiation involved in the test. What happens before the procedure? No preparation is needed for this procedure. What happens during the procedure? You will undress and remove any jewelry around your neck or chest. You will put on a hospital gown. Sticky electrodes will be placed on your chest. The electrodes will be connected to an electrocardiogram (ECG) machine to record a tracing of the electrical activity of your heart. A CT scanner will take pictures of your heart. During this time, you will be asked to lie still and hold your breath for 2-3 seconds while a picture of your heart is being taken. The procedure may vary among health care providers and hospitals. What happens after the procedure? You can get dressed. You can return to your normal activities. It is up to you to get the results of your test. Ask your health care provider, or the department that is doing the test, when your results will be ready. Summary A coronary calcium scan is an imaging test used to look for deposits of calcium and other fatty materials (plaques) in the inner lining of the blood vessels of the heart (coronary arteries). Generally, this is a safe procedure. Tell your health care provider if you are pregnant or may be pregnant. No preparation is needed for this procedure. A CT scanner will take pictures of your heart. You can return to your normal activities after the scan is done. This information is not intended to replace advice given to you by your health care provider. Make sure you discuss any questions you have with your health care provider.  Document Released: 07/13/2007 Document Revised: 12/04/2015 Document Reviewed: 12/04/2015 Elsevier Interactive Patient Education  2017 [employer].    Follow-Up: At [facility], you and your health needs are our priority.  As part of our continuing mission to provide you with exceptional heart care, we have created designated Provider  Care Teams.  These Care Teams include your primary Cardiologist (physician) and Advanced Practice Providers (APPs -  Physician Assistants and Nurse Practitioners) who all work together to provide you with the care you need, when you need it.   Your next appointment:    As needed  Provider:   Carolan Clines, MD

## 2022-07-29 NOTE — Progress Notes (Signed)
Cardiology Office Note:    Date:  07/29/2022   ID:  Connie Espinoza, DOB 03-Oct-1980, MRN [id number]  PCP:  System, Provider Not In   [facility] Providers Cardiologist:  Maisie Fus, MD     Referring MD: Randa Spike, FNP   No chief complaint on file. Family hx cardiac dx  History of Present Illness:    Connie Espinoza is a 42 y.o. female  referral for cardiac risk assessment. Father had 100% LAD at age 75 , he was otherwise healthy.  LDL 156 mg/dL, HDL 94 mg/dL. She lifts weights and she runs. She denies symptoms.    The 10-year ASCVD risk score (Arnett DK, et al., 2019) is: 0.3%   Values used to calculate the score:     Age: 14 years     Sex: Female     Is Non-Hispanic African American: No     Diabetic: No     Tobacco smoker: No     Systolic Blood Pressure: 102 mmHg     Is BP treated: No     HDL Cholesterol: 94 mg/dL     Total Cholesterol: 259 mg/dL   Past Surgical History:  Procedure Laterality Date   AUGMENTATION MAMMAPLASTY Bilateral    gel implants retro pectoral   BREAST SURGERY      Current Medications: Current Meds  Medication Sig   ALPRAZolam (XANAX) 0.25 MG tablet Take 1 tablet (0.25 mg total) by mouth daily as needed for anxiety.   Dupilumab (DUPIXENT [place]) Inject into the skin. monthly   escitalopram (LEXAPRO) 10 MG tablet Take 10 mg by mouth daily.     Allergies:   Ciprofloxacin and Doxycycline   Social History   Socioeconomic History   Marital status: Married    Spouse name: Not on file   Number of children: Not on file   Years of education: Not on file   Highest education level: Not on file  Occupational History   Not on file  Tobacco Use   Smoking status: Never   Smokeless tobacco: Never  Substance and Sexual Activity   Alcohol use: Not on file   Drug use: Not on file   Sexual activity: Not on file  Other Topics Concern   Not on file  Social History Narrative   Not on file   Social Determinants of Health    Financial Resource Strain: Not on file  Food Insecurity: Not on file  Transportation Needs: Not on file  Physical Activity: Not on file  Stress: Not on file  Social Connections: Not on file     Family History: The patient's family history includes Breast cancer in her maternal grandmother.  ROS:   Please see the history of present illness.     All other systems reviewed and are negative.  EKGs/Labs/Other Studies Reviewed:    The following studies were reviewed today:  EKG Interpretation Date/Time:  Monday July 29 2022 15:44:03 EDT Ventricular Rate:  69 PR Interval:  156 QRS Duration:  78 QT Interval:  350 QTC Calculation: 375 R Axis:   79  Text Interpretation: Normal sinus rhythm Normal ECG No previous ECGs available Confirmed by Carolan Clines ([id number]) on 07/29/2022 4:00:44 PM   EKG Interpretation Date/Time:  Monday July 29 2022 15:44:03 EDT Ventricular Rate:  69 PR Interval:  156 QRS Duration:  78 QT Interval:  350 QTC Calculation: 375 R Axis:   79  Text Interpretation: Normal sinus rhythm Normal ECG No previous ECGs  available Confirmed by Carolan Clines [phone]) on 07/29/2022 4:00:44 PM    Recent Labs: No results found for requested labs within last 365 days.  Recent Lipid Panel No results found for: "CHOL", "TRIG", "HDL", "CHOLHDL", "VLDL", "LDLCALC", "LDLDIRECT"   Risk Assessment/Calculations:         Physical Exam:    VS:  BP 102/64   Pulse 76   Ht 5\' 3"  (1.6 m)   Wt 118 lb 12.8 oz (53.9 kg)   SpO2 99%   BMI 21.04 kg/m     Wt Readings from Last 3 Encounters:  07/29/22 118 lb 12.8 oz (53.9 kg)     GEN:  Well nourished, well developed in no acute distress HEENT: Normal NECK: No JVD; No carotid bruits LYMPHATICS: No lymphadenopathy CARDIAC: RRR, no murmurs, rubs, gallops RESPIRATORY:  Clear to auscultation without rales, wheezing or rhonchi  ABDOMEN: Soft, non-tender, non-distended MUSCULOSKELETAL:  No edema; No deformity  SKIN: Warm and  dry NEUROLOGIC:  Alert and oriented x 3 PSYCHIATRIC:  Normal affect   ASSESSMENT:    1. Family history of ischemic heart disease   - She is concerning about her cardiac risk with her father being healthy prior to finding LAD lesion. Her LDL is elevated. Will get LP(a) and CAC score to risk stratify her further. We did discuss that her ASCVD is very low. Otherwise, EKG is normal and she is asymptomatic PLAN:    In order of problems listed above:  Lp(a) CAC     Medication Adjustments/Labs and Tests Ordered: Current medicines are reviewed at length with the patient today.  Concerns regarding medicines are outlined above.  Orders Placed This Encounter  Procedures   CT CARDIAC SCORING (SELF PAY ONLY)   Lipoprotein A (LPA)   EKG 12-Lead   No orders of the defined types were placed in this encounter.   Patient Instructions  Medication Instructions:  Your physician recommends that you continue on your current medications as directed. Please refer to the Current Medication list given to you today.  *If you need a refill on your cardiac medications before your next appointment, please call your pharmacy*   Lab Work: Your physician recommends that you have labs drawn: Lp(a) If you have labs (blood work) drawn today and your tests are completely normal, you will receive your results only by: MyChart Message (if you have MyChart) OR A paper copy in the mail If you have any lab test that is abnormal or we need to change your treatment, we will call you to review the results.   Testing/Procedures: This is $99 out of pocket.   Coronary CalciumScan A coronary calcium scan is an imaging test used to look for deposits of calcium and other fatty materials (plaques) in the inner lining of the blood vessels of the heart (coronary arteries). These deposits of calcium and plaques can partly clog and narrow the coronary arteries without producing any symptoms or warning signs. This puts a  person at risk for a heart attack. This test can detect these deposits before symptoms develop. Tell a health care provider about: Any allergies you have. All medicines you are taking, including vitamins, herbs, eye drops, creams, and over-the-counter medicines. Any problems you or family members have had with anesthetic medicines. Any blood disorders you have. Any surgeries you have had. Any medical conditions you have. Whether you are pregnant or may be pregnant. What are the risks? Generally, this is a safe procedure. However, problems may occur, including: Harm to  a pregnant woman and her unborn baby. This test involves the use of radiation. Radiation exposure can be dangerous to a pregnant woman and her unborn baby. If you are pregnant, you generally should not have this procedure done. Slight increase in the risk of cancer. This is because of the radiation involved in the test. What happens before the procedure? No preparation is needed for this procedure. What happens during the procedure? You will undress and remove any jewelry around your neck or chest. You will put on a hospital gown. Sticky electrodes will be placed on your chest. The electrodes will be connected to an electrocardiogram (ECG) machine to record a tracing of the electrical activity of your heart. A CT scanner will take pictures of your heart. During this time, you will be asked to lie still and hold your breath for 2-3 seconds while a picture of your heart is being taken. The procedure may vary among health care providers and hospitals. What happens after the procedure? You can get dressed. You can return to your normal activities. It is up to you to get the results of your test. Ask your health care provider, or the department that is doing the test, when your results will be ready. Summary A coronary calcium scan is an imaging test used to look for deposits of calcium and other fatty materials (plaques) in the  inner lining of the blood vessels of the heart (coronary arteries). Generally, this is a safe procedure. Tell your health care provider if you are pregnant or may be pregnant. No preparation is needed for this procedure. A CT scanner will take pictures of your heart. You can return to your normal activities after the scan is done. This information is not intended to replace advice given to you by your health care provider. Make sure you discuss any questions you have with your health care provider. Document Released: 07/13/2007 Document Revised: 12/04/2015 Document Reviewed: 12/04/2015 Elsevier Interactive Patient Education  2017 [employer].    Follow-Up: At [facility], you and your health needs are our priority.  As part of our continuing mission to provide you with exceptional heart care, we have created designated Provider Care Teams.  These Care Teams include your primary Cardiologist (physician) and Advanced Practice Providers (APPs -  Physician Assistants and Nurse Practitioners) who all work together to provide you with the care you need, when you need it.   Your next appointment:    As needed  Provider:   Carolan Clines, MD   Signed, Maisie Fus, MD  07/29/2022 4:15 PM    [facility]

## 2022-07-30 LAB — LIPOPROTEIN A (LPA): Lipoprotein (a): <8.4 nmol/L (ref <75.0)

## 2022-08-22 ENCOUNTER — Ambulatory Visit (HOSPITAL_COMMUNITY)
Admission: RE | Admit: 2022-08-22 | Discharge: 2022-08-22 | Disposition: A | Discharge status: Home / Self Care | Payer: BC Managed Care – PPO | Source: Ambulatory Visit | Attending: Internal Medicine | Admitting: Internal Medicine

## 2022-08-22 DIAGNOSIS — Z8249 Family history of ischemic heart disease and other diseases of the circulatory system: Secondary | ICD-10-CM | POA: Insufficient documentation

## 2022-08-29 ENCOUNTER — Encounter: Payer: Self-pay | Admitting: Internal Medicine

## 2022-10-28 NOTE — Unmapped (Signed)
Specialty Medication(s): Dupixent    Rhonda Forbes has been dis-enrolled from the [employer] [facility] specialty pharmacy services due to multiple unsuccessful outreach attempts by the pharmacy.    Additional information provided to the patient: Patient can contact [facility] at anytime if they wish to re enroll in pharmacy services    Sherral Hammers, PharmD  [facility] and [facility] Specialty Pharmacist

## 2023-04-30 MED ORDER — DUPIXENT 300 MG/2 ML SUBCUTANEOUS PEN INJECTOR
SUBCUTANEOUS | 11 refills | 0 days
Start: 2023-04-30 — End: ?

## 2023-05-20 ENCOUNTER — Other Ambulatory Visit: Payer: Self-pay | Admitting: Obstetrics and Gynecology

## 2023-05-20 DIAGNOSIS — Z1231 Encounter for screening mammogram for malignant neoplasm of breast: Secondary | ICD-10-CM

## 2023-06-30 ENCOUNTER — Other Ambulatory Visit: Payer: Self-pay | Admitting: Medical Genetics

## 2023-07-07 ENCOUNTER — Ambulatory Visit
Admission: RE | Admit: 2023-07-07 | Discharge: 2023-07-07 | Disposition: A | Discharge status: Home / Self Care | Source: Ambulatory Visit | Attending: Obstetrics and Gynecology | Admitting: Obstetrics and Gynecology

## 2023-07-07 DIAGNOSIS — Z1231 Encounter for screening mammogram for malignant neoplasm of breast: Secondary | ICD-10-CM

## 2023-07-17 ENCOUNTER — Other Ambulatory Visit

## 2023-07-17 DIAGNOSIS — Z006 Encounter for examination for normal comparison and control in clinical research program: Secondary | ICD-10-CM

## 2023-07-25 LAB — GENECONNECT MOLECULAR SCREEN: Genetic Analysis Overall Interpretation: NEGATIVE

## 2024-03-01 ENCOUNTER — Encounter (HOSPITAL_BASED_OUTPATIENT_CLINIC_OR_DEPARTMENT_OTHER): Payer: Self-pay
